# Patient Record
Sex: Female | Born: 1940 | Race: White | Hispanic: No | Smoking: Former smoker
Health system: Southern US, Community
[De-identification: ages and names within clinical notes are randomized; demographics above are authoritative.]

---

## 2021-04-15 ENCOUNTER — Other Ambulatory Visit: Payer: Self-pay

## 2021-04-15 ENCOUNTER — Ambulatory Visit (INDEPENDENT_AMBULATORY_CARE_PROVIDER_SITE_OTHER): Payer: Medicare Other | Admitting: Urology

## 2021-04-15 ENCOUNTER — Encounter: Payer: Self-pay | Admitting: Urology

## 2021-04-15 VITALS — BP 125/62 | HR 96

## 2021-04-15 DIAGNOSIS — R339 Retention of urine, unspecified: Secondary | ICD-10-CM | POA: Diagnosis not present

## 2021-04-15 NOTE — Progress Notes (Signed)
   04/15/2021 1:41 PM   Dawn Guerra 1941-03-31 [id number]  Referring provider: No referring provider defined for this encounter.  No chief complaint on file.   HPI: I was consulted today to assess the patient but she states she had no bladder complaint.  She did not know why she was here.  She denies any specific complaint and I could not see anything in the medical record  She voids every 2-3 hours.  She gets up once or twice at night.  She is continent  No history of bladder surgery urinary tract infections or kidney stone  On further questioning the patient actually has a Foley catheter since she was in the hospital about a month ago.  Again she could not give me any details.   PMH: No past medical history on file.  Surgical History:   Home Medications:  Allergies as of 04/15/2021   Not on File      Medication List    as of April 15, 2021  1:41 PM   You have not been prescribed any medications.     Allergies: Not on File  Family History: No family history on file.  Social History:  has no history on file for tobacco use, alcohol use, and drug use.  ROS:                                        Physical Exam: BP 125/62   Pulse 96   Constitutional:  Alert and oriented, No acute distress. HEENT: [place] AT, moist mucus membranes.  Trachea midline, no masses.  Laboratory Data: No results found for: WBC, HGB, HCT, MCV, PLT  No results found for: CREATININE  No results found for: PSA  No results found for: TESTOSTERONE  No results found for: HGBA1C  Urinalysis No results found for: COLORURINE, APPEARANCEUR, LABSPEC, PHURINE, GLUCOSEU, HGBUR, BILIRUBINUR, KETONESUR, PROTEINUR, UROBILINOGEN, NITRITE, LEUKOCYTESUR  Pertinent Imaging:   Assessment & Plan: I thought it was best to have the patient come back in early morning visit for a trial of voiding with the nurse practitioner and we will proceed accordingly.  Pathophysiology of  retention discussed.  We are trying to get any medical records sent over  There are no diagnoses linked to this encounter.  No follow-ups on file.  Martina Sinner, MD  [facility] [address] [place], [place] [address] [phone]

## 2021-04-17 ENCOUNTER — Encounter: Payer: Self-pay | Admitting: Physician Assistant

## 2021-04-17 ENCOUNTER — Ambulatory Visit: Payer: Medicare Other | Admitting: Physician Assistant

## 2021-04-17 ENCOUNTER — Other Ambulatory Visit: Payer: Self-pay

## 2021-04-17 ENCOUNTER — Ambulatory Visit (INDEPENDENT_AMBULATORY_CARE_PROVIDER_SITE_OTHER): Payer: PRIVATE HEALTH INSURANCE | Admitting: Physician Assistant

## 2021-04-17 DIAGNOSIS — R3129 Other microscopic hematuria: Secondary | ICD-10-CM | POA: Diagnosis not present

## 2021-04-17 DIAGNOSIS — R339 Retention of urine, unspecified: Secondary | ICD-10-CM | POA: Diagnosis not present

## 2021-04-17 LAB — BLADDER SCAN AMB NON-IMAGING

## 2021-04-17 NOTE — Patient Instructions (Addendum)
Return to clinic today at 2:30pm for a bladder scan to make sure you are emptying your bladder. In the meantime, drink at least 24 ounces of fluid and urinate as needed.

## 2021-04-17 NOTE — Progress Notes (Signed)
04/17/2021 9:23 AM   Dawn Guerra 07-30-1940 [id number]  CC: Chief Complaint  Patient presents with   Urinary Retention   HPI: Dawn Guerra is a 80 y.o. female with PMH urinary retention during recent hospitalization who presents today for voiding trial.   Patient was admitted at [facility] from 01/10/2021 to 01/25/2021 after being found down by her granddaughter. She was found to be in septic shock with hypernatremia, lactic acidosis, acute encephalopathy, and AKI. She was briefly intubated with pressors and treated for bilateral pneumonia with Zosyn. 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 She was transferred to [facility] in [place] from there, where she remained until 03/04/2021. She is now residing at [facility], from where she presents today.  At some point during her illness, she was found to have urinary retention with a residual of , timing unclear but possibly during her stay at [facility]. She failed a voiding trial on 9/12 and Foley was reinserted at that time with a residual of >510mL.  Notably, her son is now her HCPOA.  She underwent a CT AP with contrast on 02/27/2021. Original images unavailable for review. Radiology read reports atrophic kidneys with diffuse parenchymal thinning. No stones or hydronephrosis. He has a bilobed 4cm left upper pole cyst. 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 Her bladder was decompressed with a Foley catheter.  Outside medical records report UA with leukocytes and RBCs, but lab reports is unavailable for review  Today she reports no acute concerns. She denies a history of difficulty urinating. She is unsure if she has seen a urologist before. She reports being confused today.  Foley catheter removed in the morning, see procedure note below for further details. She returned to clinic in the afternoon. She  has been able to urinate. PVR 64mL.  PMH: No past medical history on file.  Surgical History: History reviewed. No pertinent surgical history.  Home Medications:  Allergies as of 04/17/2021   Not on File      Medication List        Accurate as of April 17, 2021  9:23 AM. If you have any questions, ask your nurse or doctor.          Eliquis 5 MG Tabs tablet Generic drug: apixaban Take 5 mg by mouth 2 (two) times daily.   mirtazapine 15 MG tablet Commonly known as: REMERON Take 15 mg by mouth at bedtime.   omeprazole 20 MG capsule Commonly known as: PRILOSEC Take 20 mg by mouth daily.   sertraline 25 MG tablet Commonly known as: ZOLOFT Take 25 mg by mouth daily.   vitamin C 500 MG tablet Commonly known as: ASCORBIC ACID Take 500 mg by mouth daily.   Zinc-220 220 (50 Zn) MG capsule Generic drug: zinc sulfate Take 220 mg by mouth daily.        Allergies:  Not on File  Family History: No family history on file.  Social History:   has no history on file for tobacco use, alcohol use, and drug use.  Physical Exam: There were no vitals taken for this visit.  Constitutional:  Alert, no acute distress, nontoxic appearing HEENT: [place], AT Cardiovascular: No clubbing, cyanosis, or edema Respiratory: Normal respiratory effort, no increased work of breathing Skin: No rashes, bruises or suspicious lesions Neurologic: Grossly intact, no focal deficits, moving all 4 extremities Psychiatric: Normal mood and  affect  Laboratory Data: Results for orders placed or performed in visit on 04/17/21  BLADDER SCAN AMB NON-IMAGING  Result Value Ref Range   Scan Result 63mL    Assessment & Plan:   1. Urinary retention Voiding trial passed. Will plan for PVR in 1 month to confirm stability. - BLADDER SCAN AMB NON-IMAGING  2. Microscopic hematuria Noted on outside medical records. Will obtain cath UA in 1 month to reassess; if persistent, recommend hematuria workup.    Return in about 4 weeks (around 05/15/2021) for CATH UA and repeat PVR.  Carman Ching, PA-C  [facility] [address], [address] [place], [place] [address] [phone]

## 2021-05-15 ENCOUNTER — Ambulatory Visit: Payer: Medicare Other | Admitting: Physician Assistant

## 2021-05-23 ENCOUNTER — Ambulatory Visit (INDEPENDENT_AMBULATORY_CARE_PROVIDER_SITE_OTHER): Payer: Medicare Other | Admitting: Physician Assistant

## 2021-05-23 ENCOUNTER — Other Ambulatory Visit: Payer: Self-pay

## 2021-05-23 VITALS — BP 132/71 | HR 81 | Ht 60.0 in

## 2021-05-23 DIAGNOSIS — R339 Retention of urine, unspecified: Secondary | ICD-10-CM

## 2021-05-23 DIAGNOSIS — R3129 Other microscopic hematuria: Secondary | ICD-10-CM

## 2021-05-23 LAB — BLADDER SCAN AMB NON-IMAGING

## 2021-05-23 NOTE — Progress Notes (Signed)
In and Out Catheterization  Patient is present today for a I & O catheterization due to microscopic hematuria. Patient was cleaned and prepped in a sterile fashion with betadine . A 14FR cath was inserted no complications were noted , 32ml of urine return was noted, urine was light yellow in color. A clean urine sample was collected for urinalysis. Bladder was drained  And catheter was removed with out difficulty.    Performed by: Franchot Erichsen CMA & Gerarda Gunther RMA

## 2021-05-23 NOTE — Progress Notes (Signed)
05/23/2021 3:36 PM   Dawn Guerra Oct 10, 1940 [id number]  CC: Chief Complaint  Patient presents with   Urinary Retention   Follow-up   HPI: Dawn Guerra is a 80 y.o. female with a recent history of urinary retention associated with prolonged illness and hospitalization who passed an outpatient voiding trial with me on 04/17/2021 who presents today for repeat UA and PVR.   Today she reports no difficulty urinating since I last saw her.  She has no acute concerns today.  In-office catheterized UA today positive for 2+ blood and 2+ leukocyte esterase; urine microscopy with 6-10 WBCs/HPF. PVR 56mL.  PMH: No past medical history on file.  Surgical History: No past surgical history on file.  Home Medications:  Allergies as of 05/23/2021   Not on File      Medication List        Accurate as of May 23, 2021  3:36 PM. If you have any questions, ask your nurse or doctor.          apixaban 5 MG Tabs tablet Commonly known as: ELIQUIS Take 5 mg by mouth 2 (two) times daily.   mirtazapine 15 MG tablet Commonly known as: REMERON Take 15 mg by mouth at bedtime.   omeprazole 20 MG capsule Commonly known as: PRILOSEC Take 20 mg by mouth daily.   sertraline 25 MG tablet Commonly known as: ZOLOFT Take 25 mg by mouth daily.   vitamin C 500 MG tablet Commonly known as: ASCORBIC ACID Take 500 mg by mouth daily.   zinc sulfate 220 (50 Zn) MG capsule Take 220 mg by mouth daily.        Allergies:  Not on File  Family History: No family history on file.  Social History:   has no history on file for tobacco use, alcohol use, and drug use.  Physical Exam: BP 132/71    Pulse 81    Ht 5' (1.524 m)   Constitutional:  Alert and oriented, no acute distress, nontoxic appearing HEENT: [place], AT Cardiovascular: No clubbing, cyanosis, or edema Respiratory: Normal respiratory effort, no increased work of breathing Skin: No rashes, bruises or suspicious  lesions Neurologic: Grossly intact, no focal deficits, moving all 4 extremities Psychiatric: Normal mood and affect  Laboratory Data: Results for orders placed or performed in visit on 05/23/21  Microscopic Examination   Urine  Result Value Ref Range   WBC, UA 6-10 (A) 0 - 5 /hpf   RBC 0-2 0 - 2 /hpf   Epithelial Cells (non renal) 0-10 0 - 10 /hpf   Bacteria, UA None seen None seen/Few  Urinalysis, Complete  Result Value Ref Range   Specific Gravity, UA 1.015 1.005 - 1.030   pH, UA 5.5 5.0 - 7.5   Color, UA Yellow Yellow   Appearance Ur Hazy (A) Clear   Leukocytes,UA 2+ (A) Negative   Protein,UA Negative Negative/Trace   Glucose, UA Negative Negative   Ketones, UA Negative Negative   RBC, UA 2+ (A) Negative   Bilirubin, UA Negative Negative   Urobilinogen, Ur 0.2 0.2 - 1.0 mg/dL   Nitrite, UA Negative Negative   Microscopic Examination See below:   Bladder Scan (Post Void Residual) in office  Result Value Ref Range   Scan Result 42mL    Assessment & Plan:   1. Urinary retention Resolved.  No further intervention indicated.  With no evidence of urologic history, okay to follow-up as needed. - Bladder Scan (Post Void Residual) in office  2.  Microscopic hematuria None on catheterized UA today, no further intervention indicated. - Urinalysis, Complete  Return if symptoms worsen or fail to improve.  Debroah Loop, PA-C  [facility] [address], [place], [place] [address] [phone]

## 2021-05-25 ENCOUNTER — Other Ambulatory Visit: Payer: Self-pay

## 2021-05-25 ENCOUNTER — Encounter: Payer: Self-pay | Admitting: Emergency Medicine

## 2021-05-25 ENCOUNTER — Emergency Department: Payer: Medicare Other

## 2021-05-25 ENCOUNTER — Emergency Department
Admission: EM | Admit: 2021-05-25 | Discharge: 2021-05-25 | Disposition: A | Discharge status: Home / Self Care | Payer: Medicare Other | Attending: Emergency Medicine | Admitting: Emergency Medicine

## 2021-05-25 DIAGNOSIS — R42 Dizziness and giddiness: Secondary | ICD-10-CM | POA: Insufficient documentation

## 2021-05-25 DIAGNOSIS — S0990XA Unspecified injury of head, initial encounter: Secondary | ICD-10-CM | POA: Diagnosis not present

## 2021-05-25 DIAGNOSIS — Y92002 Bathroom of unspecified non-institutional (private) residence single-family (private) house as the place of occurrence of the external cause: Secondary | ICD-10-CM | POA: Insufficient documentation

## 2021-05-25 DIAGNOSIS — W01198A Fall on same level from slipping, tripping and stumbling with subsequent striking against other object, initial encounter: Secondary | ICD-10-CM | POA: Insufficient documentation

## 2021-05-25 DIAGNOSIS — W19XXXA Unspecified fall, initial encounter: Secondary | ICD-10-CM

## 2021-05-25 LAB — BASIC METABOLIC PANEL
Anion gap: 7 (ref 5–15)
BUN: 31 mg/dL — ABNORMAL HIGH (ref 8–23)
CO2: 28 mmol/L (ref 22–32)
Calcium: 9 mg/dL (ref 8.9–10.3)
Chloride: 103 mmol/L (ref 98–111)
Creatinine, Ser: 0.95 mg/dL (ref 0.44–1.00)
GFR, Estimated: >60 mL/min (ref >60)
Glucose, Bld: 104 mg/dL — ABNORMAL HIGH (ref 70–99)
Potassium: 4 mmol/L (ref 3.5–5.1)
Sodium: 138 mmol/L (ref 135–145)

## 2021-05-25 LAB — CBC
HCT: 39.9 % (ref 36.0–46.0)
Hemoglobin: 12.3 g/dL (ref 12.0–15.0)
MCH: 26.7 pg (ref 26.0–34.0)
MCHC: 30.8 g/dL (ref 30.0–36.0)
MCV: 86.7 fL (ref 80.0–100.0)
Platelets: 245 10*3/uL (ref 150–400)
RBC: 4.6 MIL/uL (ref 3.87–5.11)
RDW: 14.6 % (ref 11.5–15.5)
WBC: 6.9 10*3/uL (ref 4.0–10.5)
nRBC: 0 % (ref 0.0–0.2)

## 2021-05-25 MED ORDER — MECLIZINE HCL 25 MG PO TABS: 25.0000 mg | ORAL_TABLET | Freq: Three times a day (TID) | ORAL | 0 refills | Status: AC | PRN

## 2021-05-25 NOTE — ED Provider Notes (Signed)
[facility] Emergency Department Provider Note   ____________________________________________   Event Date/Time   First MD Initiated Contact with Patient 05/25/21 1928     (approximate)  I have reviewed the triage vital signs and the nursing notes.   HISTORY  Chief Complaint Dizziness    HPI Dawn Guerra is a 80 y.o. female PE on Eliquis, GERD, and cognitive deficit who presents to the ED complaining of dizziness.  Patient reports that 3 days ago she fell in the bathroom and hit her head, is unsure whether she lost consciousness.  She states that since then she has felt dizzy with a sensation of the room spinning around her whenever she turns her head to the left.  She denies any headache, neck pain, vision changes, speech changes, numbness, or weakness.  She states she has never dealt with similar symptoms in the past, denies any history of vertigo.  She has not had any cough, chest pain, or shortness of breath.  She is currently residing at [facility] and was brought to the ED by EMS.        No past medical history on file.  There are no problems to display for this patient.   No past surgical history on file.  Prior to Admission medications   Medication Sig Start Date End Date Taking? Authorizing Provider  meclizine (ANTIVERT) 25 MG tablet Take 1 tablet (25 mg total) by mouth 3 (three) times daily as needed for dizziness. 05/25/21  Yes Chesley Noon, MD  apixaban (ELIQUIS) 5 MG TABS tablet Take 5 mg by mouth 2 (two) times daily.    [provider]  mirtazapine (REMERON) 15 MG tablet Take 15 mg by mouth at bedtime.    [provider]  omeprazole (PRILOSEC) 20 MG capsule Take 20 mg by mouth daily.    [provider]  sertraline (ZOLOFT) 25 MG tablet Take 25 mg by mouth daily.    [provider]  vitamin C (ASCORBIC ACID) 500 MG tablet Take 500 mg by mouth daily.    [provider]  zinc sulfate  220 (50 Zn) MG capsule Take 220 mg by mouth daily.    [provider]    Allergies Shellfish allergy  No family history on file.  Social History Social History   Tobacco Use   Smoking status: Never   Smokeless tobacco: Never    Review of Systems  Constitutional: No fever/chills Eyes: No visual changes. ENT: No sore throat. Cardiovascular: Denies chest pain. Respiratory: Denies shortness of breath. Gastrointestinal: No abdominal pain.  No nausea, no vomiting.  No diarrhea.  No constipation. Genitourinary: Negative for dysuria. Musculoskeletal: Negative for back pain. Skin: Negative for rash. Neurological: Negative for headaches, focal weakness or numbness.  Positive for dizziness.  ____________________________________________   PHYSICAL EXAM:  VITAL SIGNS: ED Triage Vitals [05/25/21 1714]  Enc Vitals Group     BP (!) 165/85     Pulse Rate 96     Resp 20     Temp 98.1 F (36.7 C)     Temp Source Oral     SpO2 94 %     Weight 196 lb (88.9 kg)     Height 5\' 4"  (1.626 m)     Head Circumference      Peak Flow      Pain Score 0     Pain Loc      Pain Edu?      Excl. in GC?  Constitutional: Alert and oriented. Eyes: Conjunctivae are normal.  Pupils equal, round, and reactive to light bilaterally. Head: Atraumatic. Nose: No congestion/rhinnorhea. Mouth/Throat: Mucous membranes are moist. Neck: Normal ROM Cardiovascular: Normal rate, regular rhythm. Grossly normal heart sounds.  2+ radial pulses bilaterally. Respiratory: Normal respiratory effort.  No retractions. Lungs CTAB. Gastrointestinal: Soft and nontender. No distention. Genitourinary: deferred Musculoskeletal: No lower extremity tenderness nor edema. Neurologic:  Normal speech and language. No gross focal neurologic deficits are appreciated. Skin:  Skin is warm, dry and intact. No rash noted. Psychiatric: Mood and affect are normal. Speech and behavior are  normal.  ____________________________________________   LABS (all labs ordered are listed, but only abnormal results are displayed)  Labs Reviewed  BASIC METABOLIC PANEL - Abnormal; Notable for the following components:      Result Value   Glucose, Bld 104 (*)    BUN 31 (*)    All other components within normal limits  CBC  URINALYSIS, ROUTINE W REFLEX MICROSCOPIC  CBG MONITORING, ED   ____________________________________________  EKG  ED ECG REPORT I, Chesley Noon, the attending physician, personally viewed and interpreted this ECG.   Date: 05/25/2021  EKG Time: 17:16  Rate: 68  Rhythm: normal sinus rhythm  Axis: LAD  Intervals:none  ST&T Change: None   PROCEDURES  Procedure(s) performed (including Critical Care):  Procedures   ____________________________________________   INITIAL IMPRESSION / ASSESSMENT AND PLAN / ED COURSE      80 year old female with past medical history of PE on Eliquis, cognitive deficit, and GERD who presents to the ED complaining of dizziness and sensation of the room spinning around her when she turns her head to the left since a fall 3 days ago.  Patient is alert and oriented, has no focal neurologic deficits on exam.  CT head and cervical spine obtained from triage and are negative for acute process.  EKG shows no evidence of arrhythmia or ischemia and labs are unremarkable.  Symptoms sound consistent with a peripheral vertigo versus concussion.  She is appropriate for outpatient management and will be prescribed meclizine for use as needed.  She was counseled to return to the ED for new worsening symptoms, patient agrees with plan.      ____________________________________________   FINAL CLINICAL IMPRESSION(S) / ED DIAGNOSES  Final diagnoses:  Vertigo  Dizziness  Fall, initial encounter     ED Discharge Orders          Ordered    meclizine (ANTIVERT) 25 MG tablet  3 times daily PRN        05/25/21 2001              Note:  This document was prepared using Dragon voice recognition software and may include unintentional dictation errors.    Chesley Noon, MD 05/25/21 2005

## 2021-05-25 NOTE — ED Triage Notes (Signed)
Patient arrived by EMS from [facility]. Reports fall a few days and hitting head. C/o when laying down and looking to the left getting dizzy. V/S WNL per EMS

## 2021-05-25 NOTE — ED Triage Notes (Signed)
Pt via EMS from [facility]. Pt states she fell hit her head 3-4 days ago and since then she has increased dizziness when she lays on her left side. Pt A&OX4 and NAD.

## 2021-05-28 LAB — URINALYSIS, COMPLETE
Bilirubin, UA: NEGATIVE
Glucose, UA: NEGATIVE
Ketones, UA: NEGATIVE
Nitrite, UA: NEGATIVE
Protein,UA: NEGATIVE
Specific Gravity, UA: 1.015 (ref 1.005–1.030)
Urobilinogen, Ur: 0.2 mg/dL (ref 0.2–1.0)
pH, UA: 5.5 (ref 5.0–7.5)

## 2021-05-28 LAB — MICROSCOPIC EXAMINATION: Bacteria, UA: NONE SEEN

## 2022-03-17 ENCOUNTER — Telehealth: Payer: Self-pay

## 2022-03-17 NOTE — Telephone Encounter (Signed)
Zenovia Jordan from [facility] called to schedule new patient appt, per the PA at nursing facility where patient lives.  Appointment scheduled.  Transportation confirmed. Georg Ruddle, RN

## 2022-03-24 ENCOUNTER — Ambulatory Visit: Payer: Medicare Other | Admitting: Cardiology

## 2022-03-26 ENCOUNTER — Ambulatory Visit: Payer: Medicare Other | Admitting: Family

## 2022-03-26 ENCOUNTER — Telehealth: Payer: Self-pay | Admitting: Family

## 2022-03-26 NOTE — Telephone Encounter (Signed)
Patient did not show for her initial [facility] appointment on 03/26/22. Will attempt to reschedule.

## 2022-05-12 ENCOUNTER — Ambulatory Visit: Payer: Medicare Other | Attending: Cardiology | Admitting: Cardiology

## 2022-05-12 ENCOUNTER — Encounter: Payer: Self-pay | Admitting: Cardiology

## 2022-05-12 VITALS — BP 128/70 | HR 70 | Ht 63.0 in | Wt 232.8 lb

## 2022-05-12 DIAGNOSIS — R011 Cardiac murmur, unspecified: Secondary | ICD-10-CM

## 2022-05-12 NOTE — Progress Notes (Signed)
Cardiology Office Note:    Date:  05/12/2022   ID:  Dawn Guerra, DOB Mar 13, 1941, MRN [id number]  PCP:  Aviva Kluver   [facility] Providers Cardiologist:  Debbe Odea, MD     Referring MD: Wyman Songster, AGNP-C   Chief Complaint  Patient presents with   New Patient (Initial Visit)    Consult to r/o Heart failure    History of Present Illness:    Dawn Guerra is a 81 y.o. female with a hx of PE, morbid obesity who presents with systolic murmur.  Patient states being told she had a systolic murmur in the past.  Denies chest pain or shortness of breath.  Has occasional cough when she lays flat.  Denies palpitations, denies any history of heart disease.  Overall feels well, ambulates with a walker, no concerns at this time.  History reviewed. No pertinent past medical history.  History reviewed. No pertinent surgical history.  Current Medications: Current Meds  Medication Sig   aspirin EC 325 MG tablet Take 325 mg by mouth every 6 (six) hours as needed for mild pain.   vitamin C (ASCORBIC ACID) 500 MG tablet Take 500 mg by mouth daily.     Allergies:   Shellfish allergy   Social History   Socioeconomic History   Marital status: Widowed    Spouse name: Not on file   Number of children: Not on file   Years of education: Not on file   Highest education level: Not on file  Occupational History   Not on file  Tobacco Use   Smoking status: Former    Types: Cigarettes   Smokeless tobacco: Never  Substance and Sexual Activity   Alcohol use: Not Currently   Drug use: Never   Sexual activity: Not on file  Other Topics Concern   Not on file  Social History Narrative   Not on file   Social Determinants of Health   Financial Resource Strain: Not on file  Food Insecurity: Not on file  Transportation Needs: Not on file  Physical Activity: Not on file  Stress: Not on file  Social Connections: Not on file     Family History: The patient's family history  includes Heart disease in her brother.  ROS:   Please see the history of present illness.     All other systems reviewed and are negative.  EKGs/Labs/Other Studies Reviewed:    The following studies were reviewed today:   EKG:  EKG is  ordered today.  The ekg ordered today demonstrates normal sinus rhythm,  Recent Labs: 05/25/2021: BUN 31; Creatinine, Ser 0.95; Hemoglobin 12.3; Platelets 245; Potassium 4.0; Sodium 138  Recent Lipid Panel No results found for: "CHOL", "TRIG", "HDL", "CHOLHDL", "VLDL", "LDLCALC", "LDLDIRECT"   Risk Assessment/Calculations:             Physical Exam:    VS:  BP 128/70 (BP Location: Right Arm)   Pulse 70   Ht 5\' 3"  (1.6 m)   Wt 232 lb 12.8 oz (105.6 kg)   SpO2 93%   BMI 41.24 kg/m     Wt Readings from Last 3 Encounters:  05/12/22 232 lb 12.8 oz (105.6 kg)  05/25/21 196 lb (88.9 kg)     GEN:  Well nourished, well developed in no acute distress HEENT: Normal NECK: No JVD; No carotid bruits CARDIAC: RRR, faint systolic murmur RESPIRATORY:  Clear to auscultation without rales, wheezing or rhonchi  ABDOMEN: Soft, non-tender, distended MUSCULOSKELETAL:  No edema;  No deformity  SKIN: Warm and dry NEUROLOGIC:  Alert and oriented x 3 PSYCHIATRIC:  Normal affect   ASSESSMENT:    1. Systolic murmur   2. Morbid obesity (HCC)    PLAN:    In order of problems listed above:  Systolic murmur, get echo to evaluate any significant structural abnormalities. Morbid obesity, low-calorie diet, weight loss advised.  Follow-up after echo.       Medication Adjustments/Labs and Tests Ordered: Current medicines are reviewed at length with the patient today.  Concerns regarding medicines are outlined above.  Orders Placed This Encounter  Procedures   EKG 12-Lead   ECHOCARDIOGRAM COMPLETE   No orders of the defined types were placed in this encounter.   Patient Instructions  Medication Instructions:   Your physician recommends that you  continue on your current medications as directed. Please refer to the Current Medication list given to you today.  *If you need a refill on your cardiac medications before your next appointment, please call your pharmacy*   Lab Work:  None Ordered  If you have labs (blood work) drawn today and your tests are completely normal, you will receive your results only by: MyChart Message (if you have MyChart) OR A paper copy in the mail If you have any lab test that is abnormal or we need to change your treatment, we will call you to review the results.   Testing/Procedures:  Echocardiogram   Your physician has requested that you have an echocardiogram. Echocardiography is a painless test that uses sound waves to create images of your heart. It provides your doctor with information about the size and shape of your heart and how well your heart's chambers and valves are working. This procedure takes approximately one hour. There are no restrictions for this procedure. Please note; depending on visual quality an IV may need to be placed.    Follow-Up: At [facility], you and your health needs are our priority.  As part of our continuing mission to provide you with exceptional heart care, we have created designated Provider Care Teams.  These Care Teams include your primary Cardiologist (physician) and Advanced Practice Providers (APPs -  Physician Assistants and Nurse Practitioners) who all work together to provide you with the care you need, when you need it.  We recommend signing up for the patient portal called "MyChart".  Sign up information is provided on this After Visit Summary.  MyChart is used to connect with patients for Virtual Visits (Telemedicine).  Patients are able to view lab/test results, encounter notes, upcoming appointments, etc.  Non-urgent messages can be sent to your provider as well.   To learn more about what you can do with MyChart, go to [email or web address].     Your next appointment:     After Echocardiogram  The format for your next appointment:   In Person  Provider:   You may see Debbe Odea, MD or one of the following Advanced Practice Providers on your designated Care Team:   Nicolasa Ducking, NP Eula Listen, PA-C Cadence Fransico Michael, PA-C Charlsie Quest, NP    Signed, Debbe Odea, MD  05/12/2022 2:38 PM    [facility]

## 2022-05-12 NOTE — Patient Instructions (Signed)
Medication Instructions:   Your physician recommends that you continue on your current medications as directed. Please refer to the Current Medication list given to you today.  *If you need a refill on your cardiac medications before your next appointment, please call your pharmacy*   Lab Work:  None Ordered  If you have labs (blood work) drawn today and your tests are completely normal, you will receive your results only by: MyChart Message (if you have MyChart) OR A paper copy in the mail If you have any lab test that is abnormal or we need to change your treatment, we will call you to review the results.   Testing/Procedures:  Echocardiogram   Your physician has requested that you have an echocardiogram. Echocardiography is a painless test that uses sound waves to create images of your heart. It provides your doctor with information about the size and shape of your heart and how well your heart's chambers and valves are working. This procedure takes approximately one hour. There are no restrictions for this procedure. Please note; depending on visual quality an IV may need to be placed.     Follow-Up: At [facility], you and your health needs are our priority.  As part of our continuing mission to provide you with exceptional heart care, we have created designated Provider Care Teams.  These Care Teams include your primary Cardiologist (physician) and Advanced Practice Providers (APPs -  Physician Assistants and Nurse Practitioners) who all work together to provide you with the care you need, when you need it.  We recommend signing up for the patient portal called "MyChart".  Sign up information is provided on this After Visit Summary.  MyChart is used to connect with patients for Virtual Visits (Telemedicine).  Patients are able to view lab/test results, encounter notes, upcoming appointments, etc.  Non-urgent messages can be sent to your provider as well.   To learn more  about what you can do with MyChart, go to [email or web address].    Your next appointment:    After Echocardiogram  The format for your next appointment:   In Person  Provider:   You may see Brian Agbor-Etang, MD or one of the following Advanced Practice Providers on your designated Care Team:   Christopher Berge, NP Ryan Dunn, PA-C Cadence Furth, PA-C Sheri Hammock, NP     

## 2022-07-01 ENCOUNTER — Ambulatory Visit: Payer: Medicare PPO | Attending: Cardiology

## 2022-07-01 DIAGNOSIS — R011 Cardiac murmur, unspecified: Secondary | ICD-10-CM

## 2022-07-02 LAB — ECHOCARDIOGRAM COMPLETE
AR max vel: 2 cm2
AV Area VTI: 2.5 cm2
AV Area mean vel: 2.13 cm2
AV Mean grad: 3 mmHg
AV Peak grad: 5.6 mmHg
Ao pk vel: 1.18 m/s
Area-P 1/2: 3.34 cm2
S' Lateral: 3.4 cm
Single Plane A4C EF: 55.9 %

## 2022-07-03 ENCOUNTER — Telehealth: Payer: Self-pay | Admitting: Cardiology

## 2022-07-03 NOTE — Telephone Encounter (Signed)
Requested documents faxed.

## 2022-07-03 NOTE — Telephone Encounter (Signed)
Spoke with Anderson Malta over at [facility] to confirm needed documents. Office visit note, after visit summary, and appointment calendar for February is all that she needs. Advised I would get that sent over to them shortly. She verbalized understanding with no further questions at this time.

## 2022-07-03 NOTE — Telephone Encounter (Signed)
[facility]  calling stating they would like pt's AVS faxed over to them with her next appt details    Fax number : [phone]

## 2022-07-07 ENCOUNTER — Encounter: Payer: Self-pay | Admitting: Cardiology

## 2022-07-07 ENCOUNTER — Ambulatory Visit: Payer: Medicare PPO | Attending: Cardiology | Admitting: Cardiology

## 2022-07-07 VITALS — BP 130/74 | HR 76 | Ht 64.0 in | Wt 230.8 lb

## 2022-07-07 DIAGNOSIS — I358 Other nonrheumatic aortic valve disorders: Secondary | ICD-10-CM

## 2022-07-07 NOTE — Progress Notes (Signed)
Cardiology Office Note:    Date:  07/07/2022   ID:  Dawn Guerra, DOB 1941/03/13, MRN [id number]  PCP:  Kathyrn Lass   [place] [facility] Cardiologist:  Kate Sable, MD     Referring MD: No ref. provider found   Chief Complaint  Patient presents with   Follow-up    Testing F/U, no new cardiac concerns     History of Present Illness:    Dawn Guerra is a 82 y.o. female with a hx of PE, morbid obesity, cardiac murmur who presents for follow-up.    Previously seen due to systolic murmur on exam.  Echocardiogram was obtained to evaluate any significant structural/valvular abnormalities.  Presents for testing results, feels well, has no new concerns at this time.  Does not have a primary care physician, would like her number to our primary physician network.   History reviewed. No pertinent past medical history.  History reviewed. No pertinent surgical history.  Current Medications: Current Meds  Medication Sig   apixaban (ELIQUIS) 5 MG TABS tablet Take 5 mg by mouth 2 (two) times daily.   aspirin EC 325 MG tablet Take 325 mg by mouth every 6 (six) hours as needed for mild pain.   omeprazole (PRILOSEC) 20 MG capsule Take 20 mg by mouth daily.   sertraline (ZOLOFT) 25 MG tablet Take 25 mg by mouth daily.   vitamin C (ASCORBIC ACID) 500 MG tablet Take 500 mg by mouth daily.   zinc sulfate 220 (50 Zn) MG capsule Take 220 mg by mouth daily.     Allergies:   Shellfish allergy   Social History   Socioeconomic History   Marital status: Widowed    Spouse name: Not on file   Number of children: Not on file   Years of education: Not on file   Highest education level: Not on file  Occupational History   Not on file  Tobacco Use   Smoking status: Former    Types: Cigarettes   Smokeless tobacco: Never  Substance and Sexual Activity   Alcohol use: Not Currently   Drug use: Never   Sexual activity: Not on file  Other Topics Concern   Not on file  Social History  Narrative   Not on file   Social Determinants of Health   Financial Resource Strain: Not on file  Food Insecurity: Not on file  Transportation Needs: Not on file  Physical Activity: Not on file  Stress: Not on file  Social Connections: Not on file     Family History: The patient's family history includes Heart disease in her brother.  ROS:   Please see the history of present illness.     All other systems reviewed and are negative.  EKGs/Labs/Other Studies Reviewed:    The following studies were reviewed today:   EKG:  EKG not ordered today.    Recent Labs: No results found for requested labs within last 365 days.  Recent Lipid Panel No results found for: "CHOL", "TRIG", "HDL", "CHOLHDL", "VLDL", "LDLCALC", "LDLDIRECT"   Risk Assessment/Calculations:             Physical Exam:    VS:  BP 130/74 (BP Location: Left Arm, Patient Position: Sitting, Cuff Size: Large)   Pulse 76   Ht 5\' 4"  (1.626 m)   Wt 230 lb 12.8 oz (104.7 kg)   SpO2 93%   BMI 39.62 kg/m     Wt Readings from Last 3 Encounters:  07/07/22 230 lb 12.8 oz (  104.7 kg)  05/12/22 232 lb 12.8 oz (105.6 kg)  05/25/21 196 lb (88.9 kg)     GEN:  Well nourished, well developed in no acute distress HEENT: Normal NECK: No JVD; No carotid bruits CARDIAC: RRR, faint systolic murmur RESPIRATORY:  Clear to auscultation without rales, wheezing or rhonchi  ABDOMEN: Soft, non-tender, distended MUSCULOSKELETAL:  No edema; No deformity  SKIN: Warm and dry NEUROLOGIC:  Alert and oriented x 3 PSYCHIATRIC:  Normal affect   ASSESSMENT:    1. Aortic valve sclerosis   2. Morbid obesity ([place])    PLAN:    In order of problems listed above:  Echo shows normal systolic function, mild aortic valve sclerosis with no significant stenosis, which is likely reason for patient's murmur.  EF normal at 60 to 65%.  Patient made aware of results. Morbid obesity, low-calorie diet, weight loss advised.  Follow-up as  needed.      Medication Adjustments/Labs and Tests Ordered: Current medicines are reviewed at length with the patient today.  Concerns regarding medicines are outlined above.  No orders of the defined types were placed in this encounter.  No orders of the defined types were placed in this encounter.   Patient Instructions  Medication Instructions:   Your physician recommends that you continue on your current medications as directed. Please refer to the Current Medication list given to you today.  *If you need a refill on your cardiac medications before your next appointment, please call your pharmacy*   Lab Work:  None Ordered  If you have labs (blood work) drawn today and your tests are completely normal, you will receive your results only by: [place] (if you have MyChart) OR A paper copy in the mail If you have any lab test that is abnormal or we need to change your treatment, we will call you to review the results.   Testing/Procedures:  None Ordered   Follow-Up: At [facility], you and your health needs are our priority.  As part of our continuing mission to provide you with exceptional heart care, we have created designated Provider Care Teams.  These Care Teams include your primary Cardiologist (physician) and Advanced Practice Providers (APPs -  Physician Assistants and Nurse Practitioners) who all work together to provide you with the care you need, when you need it.  We recommend signing up for the patient portal called "MyChart".  Sign up information is provided on this After Visit Summary.  MyChart is used to connect with patients for Virtual Visits (Telemedicine).  Patients are able to view lab/test results, encounter notes, upcoming appointments, etc.  Non-urgent messages can be sent to your provider as well.   To learn more about what you can do with MyChart, go to [email or web address].    Your next appointment:    As needed  Other  Instructions  Please set up primary care physician   You may call [place] @ [phone] for a list of primary care providers in your area Or visit their website [email or web address] Please have any insurance card available before calling or going online.      Signed, Kate Sable, MD  07/07/2022 2:51 PM    [place]

## 2022-07-07 NOTE — Patient Instructions (Signed)
Medication Instructions:   Your physician recommends that you continue on your current medications as directed. Please refer to the Current Medication list given to you today.  *If you need a refill on your cardiac medications before your next appointment, please call your pharmacy*   Lab Work:  None Ordered  If you have labs (blood work) drawn today and your tests are completely normal, you will receive your results only by: [place] (if you have MyChart) OR A paper copy in the mail If you have any lab test that is abnormal or we need to change your treatment, we will call you to review the results.   Testing/Procedures:  None Ordered   Follow-Up: At [facility], you and your health needs are our priority.  As part of our continuing mission to provide you with exceptional heart care, we have created designated Provider Care Teams.  These Care Teams include your primary Cardiologist (physician) and Advanced Practice Providers (APPs -  Physician Assistants and Nurse Practitioners) who all work together to provide you with the care you need, when you need it.  We recommend signing up for the patient portal called "MyChart".  Sign up information is provided on this After Visit Summary.  MyChart is used to connect with patients for Virtual Visits (Telemedicine).  Patients are able to view lab/test results, encounter notes, upcoming appointments, etc.  Non-urgent messages can be sent to your provider as well.   To learn more about what you can do with MyChart, go to [email or web address].    Your next appointment:    As needed  Other Instructions  Please set up primary care physician   You may call [place] @ [phone] for a list of primary care providers in your area Or visit their website [email or web address] Please have any insurance card available before calling or going online.

## 2023-11-30 IMAGING — CT CT HEAD W/O CM
4 series · 17 of 47 positions shown, 19 images · non-contrast
Comparison: No pertinent prior exams available for comparison.

CLINICAL DATA: Provided history: Head trauma, moderate/severe.
Additional history provided: Patient experience fall (hitting head)
3-4 days ago, increased dizziness since that time when lying on left
side.

EXAM:
CT HEAD WITHOUT CONTRAST
TECHNIQUE: Contiguous axial images were obtained from the base of the skull
through the vertex without intravenous contrast.

[Series 2: head wo · axial · 0.47mm/px · z∈[+418,+543]mm · 7 of 35 slices shown, 9 images]
[im 5/35  brain]
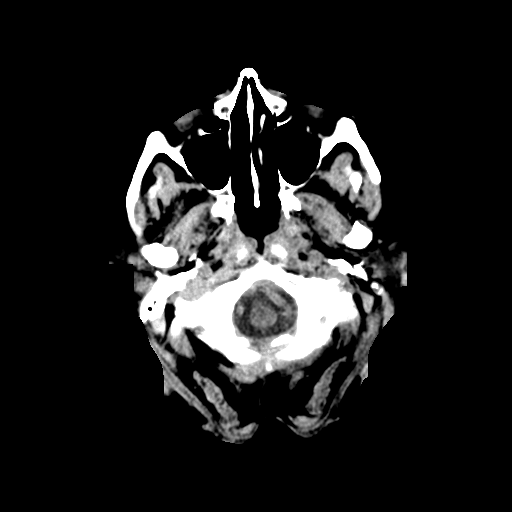
[im 5/35  bone]
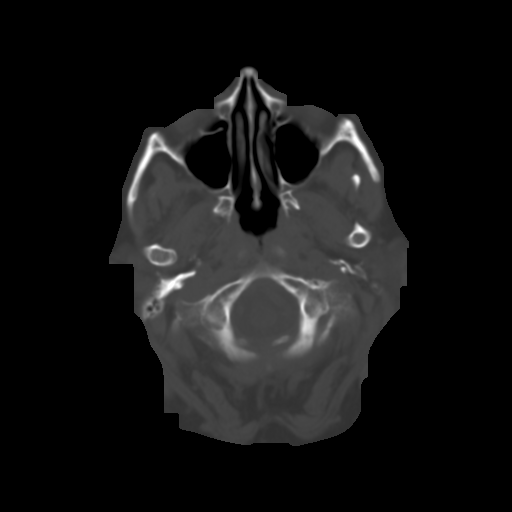
[im 9/35  brain]
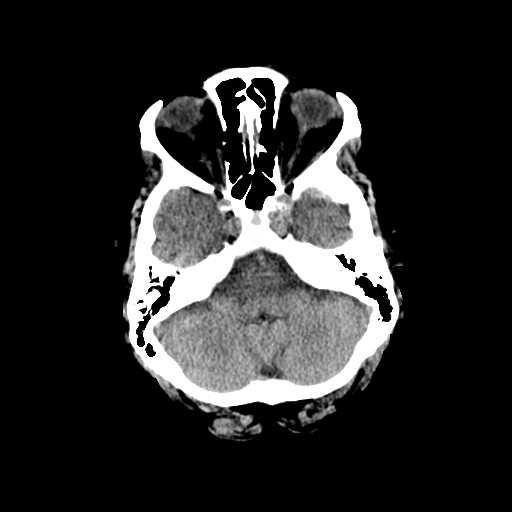
[im 13/35  brain]
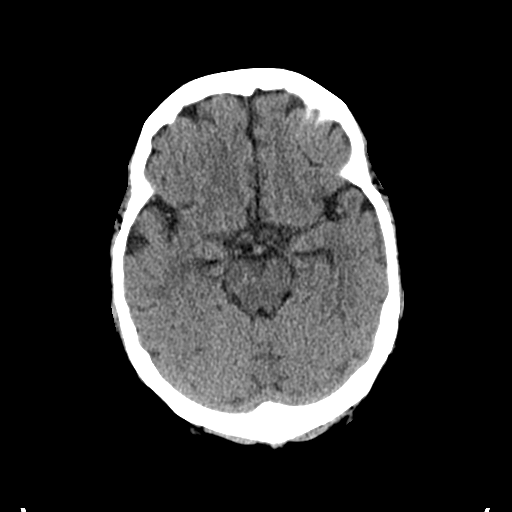
[im 18/35  brain]
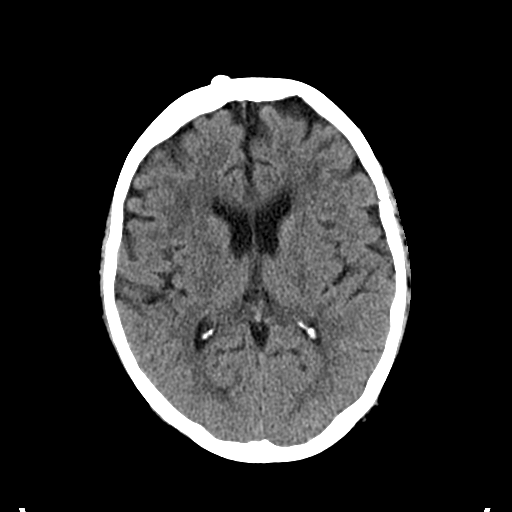
[im 22/35  brain]
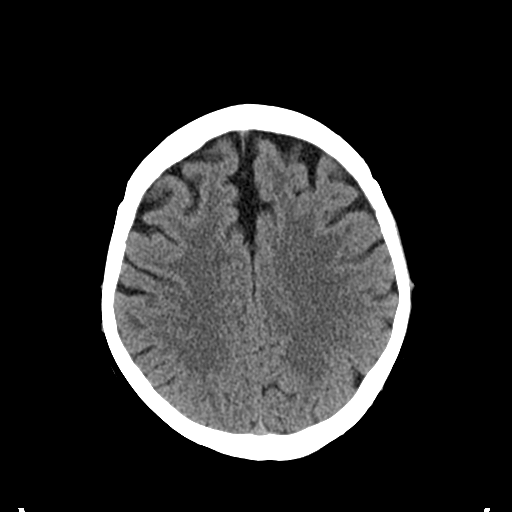
[im 22/35  bone]
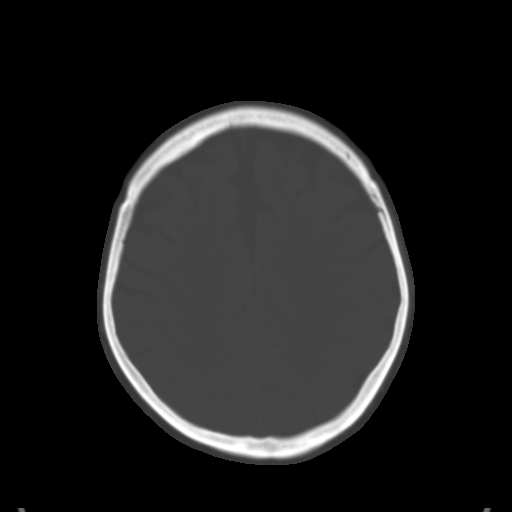
[im 26/35  brain]
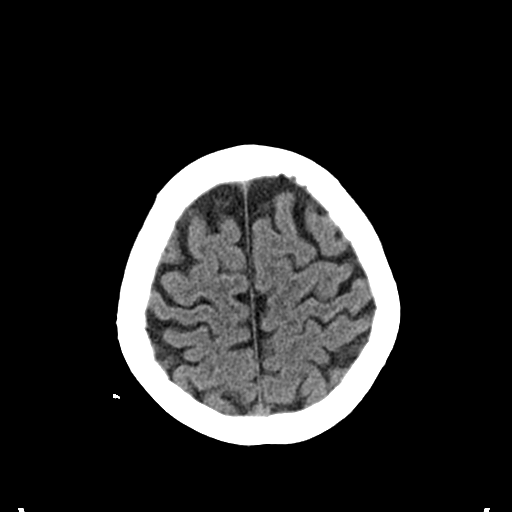
[im 30/35  brain]
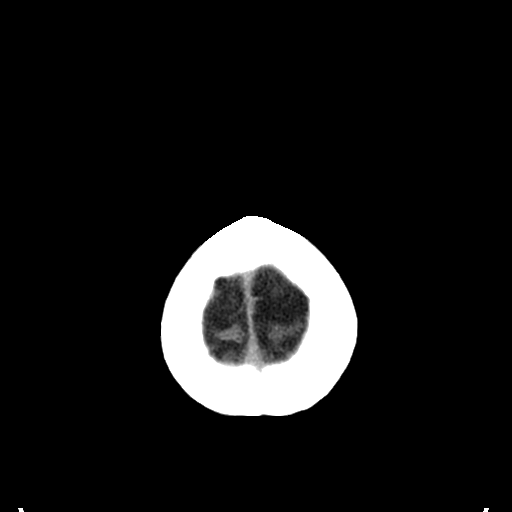

[Series 3: head bone · axial · 0.47mm/px · z∈[+414,+476]mm · 4 of 88 slices shown]
[im 9/88  bone]
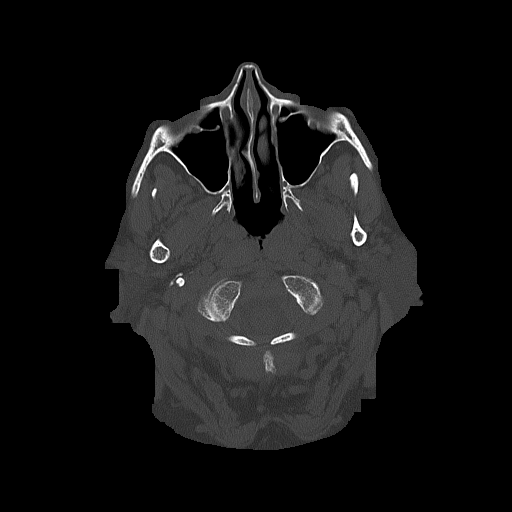
[im 18/88  bone]
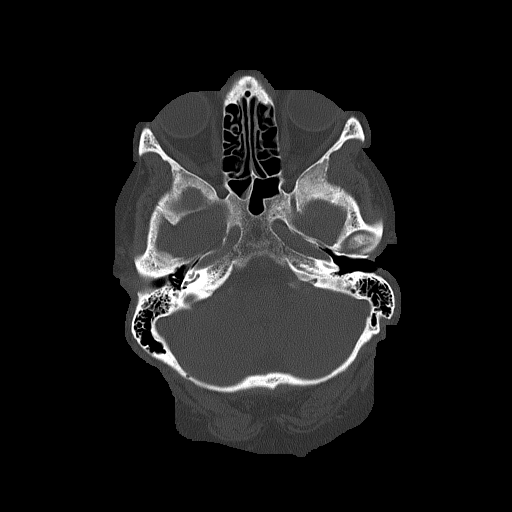
[im 27/88  bone]
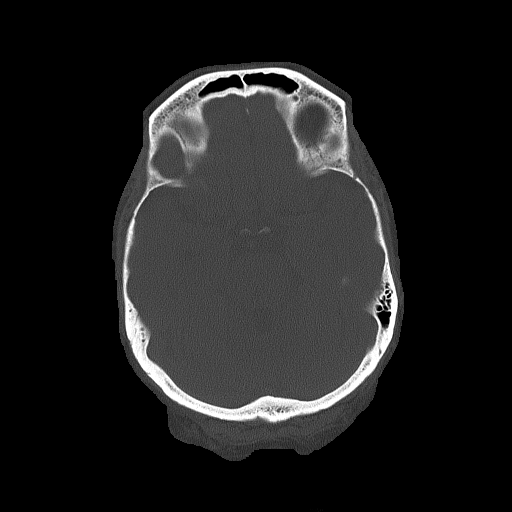
[im 40/88  bone]
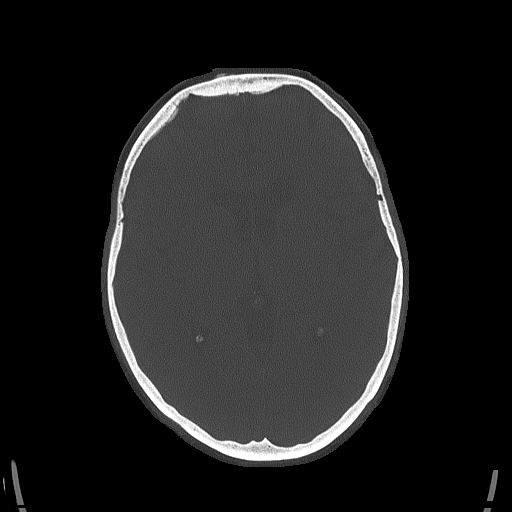

[Series 4: coronal soft tissue · coronal · 0.34mm/px · 3 of 69 slices shown]
[im 23/69  brain]
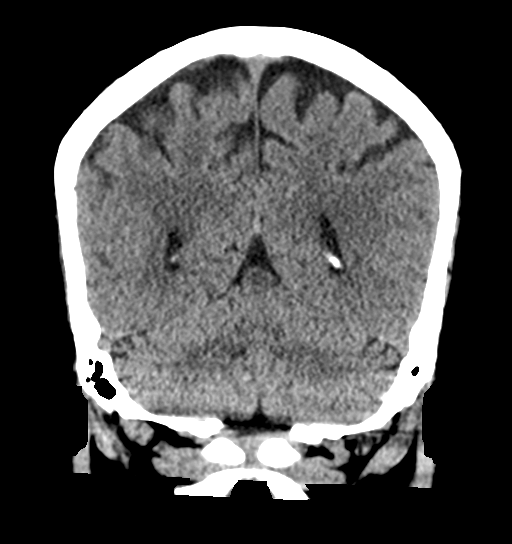
[im 31/69  brain]
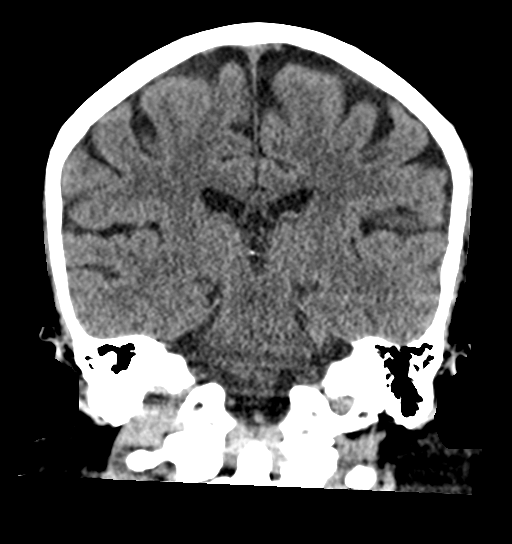
[im 38/69  brain]
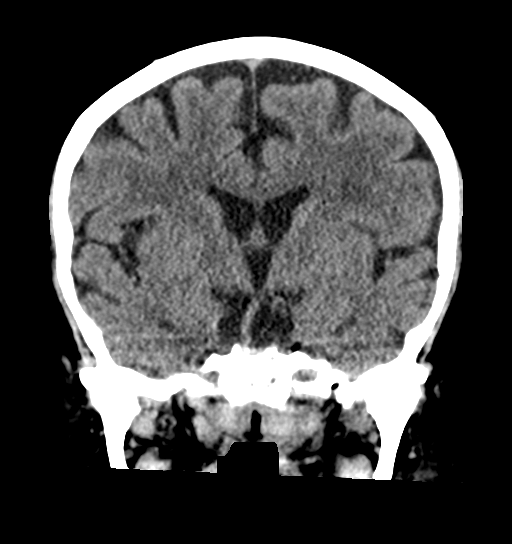

[Series 5: sagittal soft tissue · sagittal · 0.38mm/px · 3 of 58 slices shown]
[im 20/58  brain]
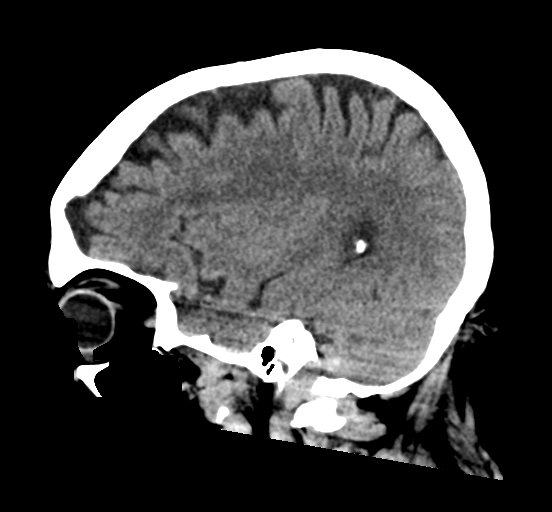
[im 29/58  brain]
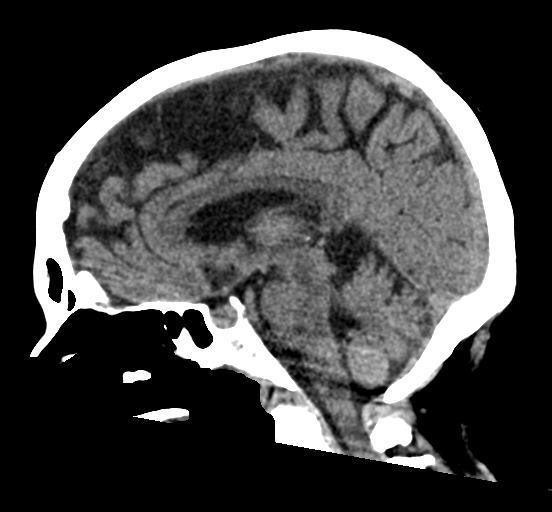
[im 39/58  brain]
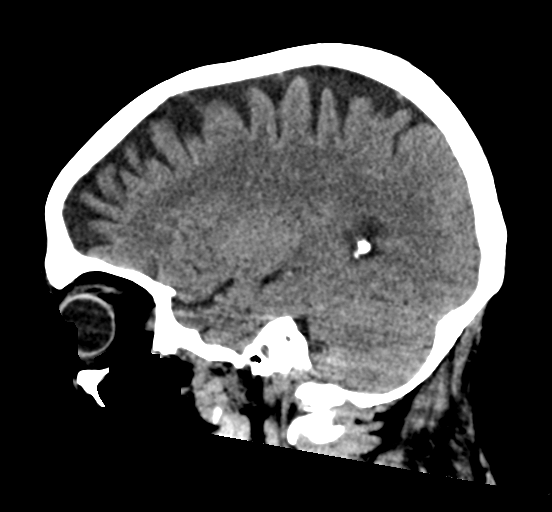

[17 of 47 positions shown; findings below may reference images not displayed]

FINDINGS: Brain:

Mild generalized parenchymal atrophy.

Mild patchy and ill-defined hypoattenuation within the cerebral
white matter, nonspecific but compatible with chronic small vessel
ischemic disease.

There is no acute intracranial hemorrhage.

No demarcated cortical infarct.

No extra-axial fluid collection.

No evidence of an intracranial mass.

No midline shift.

Vascular: No hyperdense vessel. Atherosclerotic calcifications.

Skull: No calvarial fracture. 5 mm bony exostosis extending outward
from the right frontal calvarium, likely reflecting a small osteoma.

Sinuses/Orbits: Visualized orbits show no acute finding. Minimal
mucosal thickening within the bilateral ethmoid and right sphenoid
sinuses.
IMPRESSION: No evidence of acute intracranial abnormality.

Mild chronic small vessel ischemic changes within the cerebral white
matter.

Mild generalized parenchymal atrophy.

Minimal mucosal thickening within the bilateral ethmoid and right
sphenoid sinuses.

## 2023-11-30 IMAGING — CT CT CERVICAL SPINE W/O CM
3 of 4 series · 13 of 33 positions shown, 16 images · non-contrast
Comparison: No pertinent prior exams available for comparison.

CLINICAL DATA: Provided history: Head trauma, moderate/severe.
Additional history provided: Patient fell (hitting head) 3-4 days
ago, increased dizziness since that time.

EXAM:
CT CERVICAL SPINE WITHOUT CONTRAST
TECHNIQUE: Multidetector CT imaging of the cervical spine was performed without
intravenous contrast. Multiplanar CT image reconstructions were also
generated.

[Series 6: orthogonal bone · axial · 0.31mm/px · z∈[+241,+346]mm · 5 of 89 slices shown, 7 images]
[im 15/89  soft-tissue]
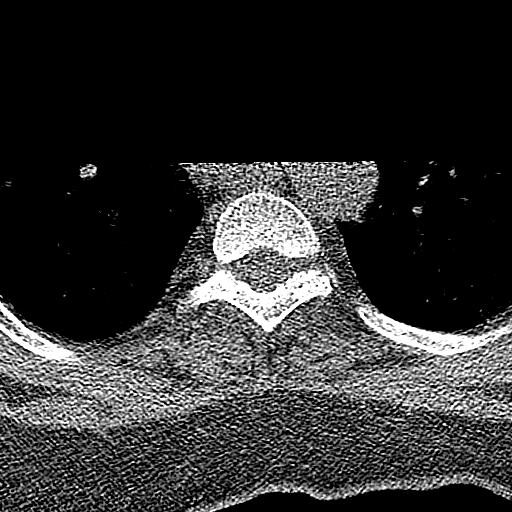
[im 15/89  bone]
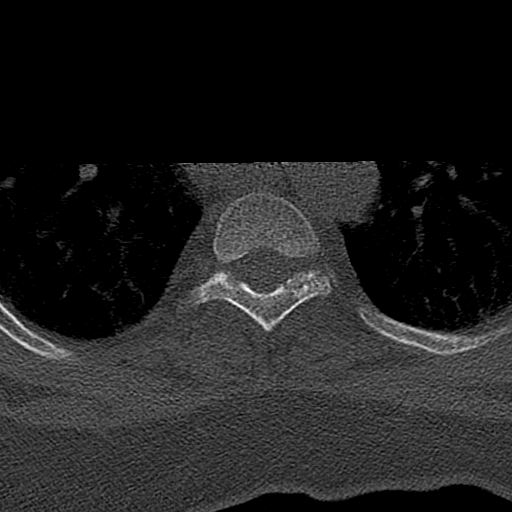
[im 30/89  bone]
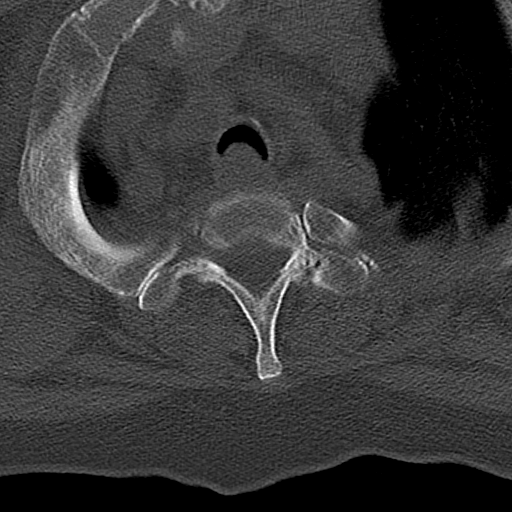
[im 45/89  bone]
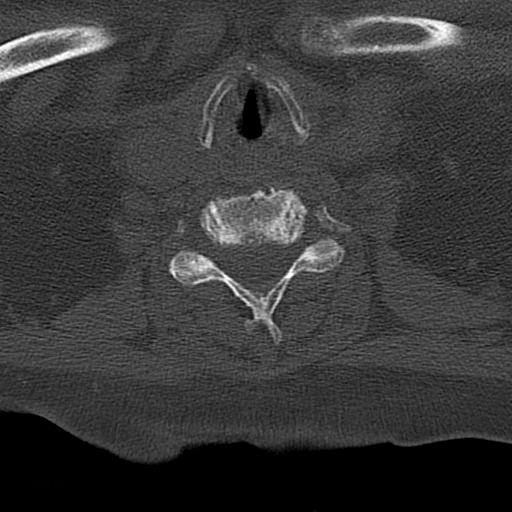
[im 59/89  bone]
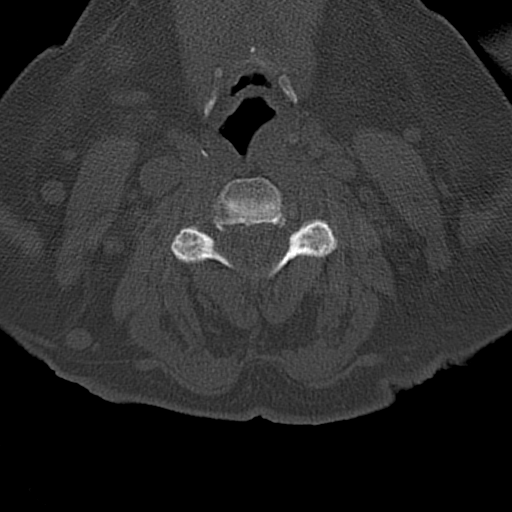
[im 74/89  soft-tissue]
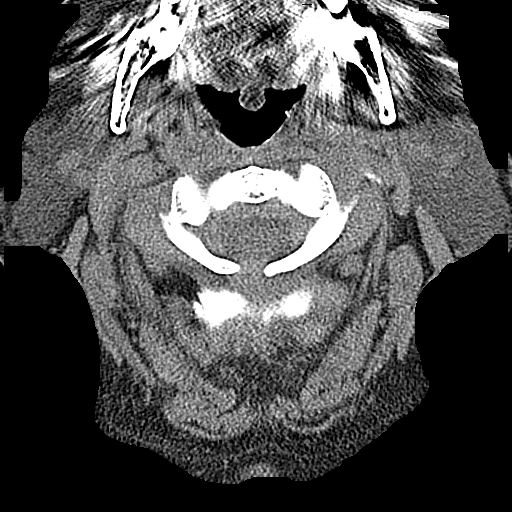
[im 74/89  bone]
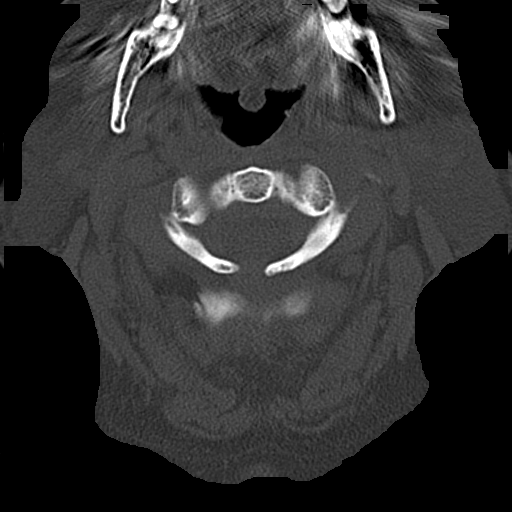

[Series 7: sagittal bone · sagittal · 0.29mm/px · 5 of 84 slices shown, 6 images]
[im 28/84  bone]
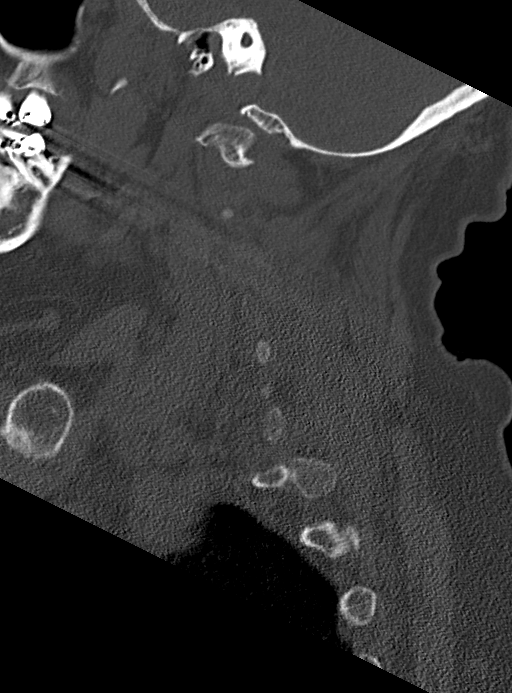
[im 35/84  bone]
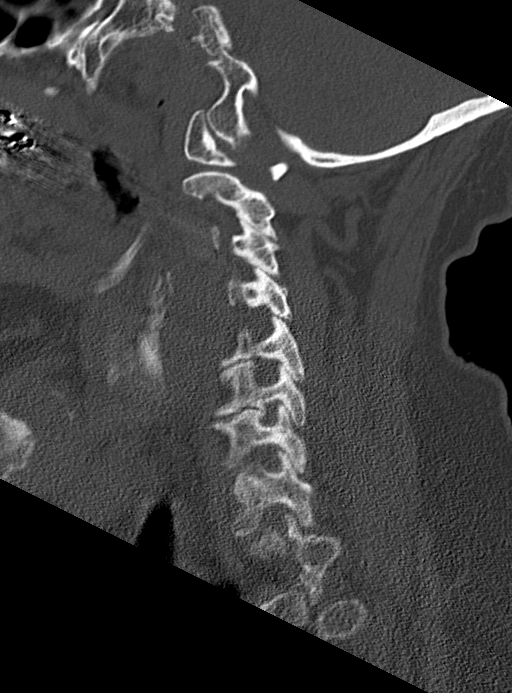
[im 42/84  soft-tissue]
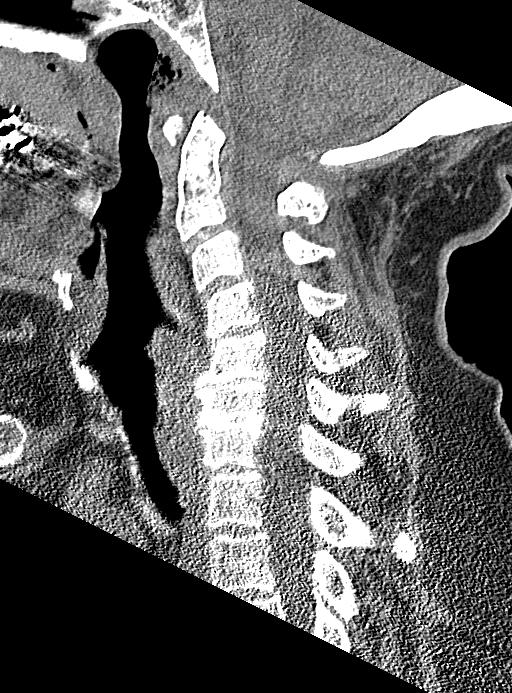
[im 42/84  bone]
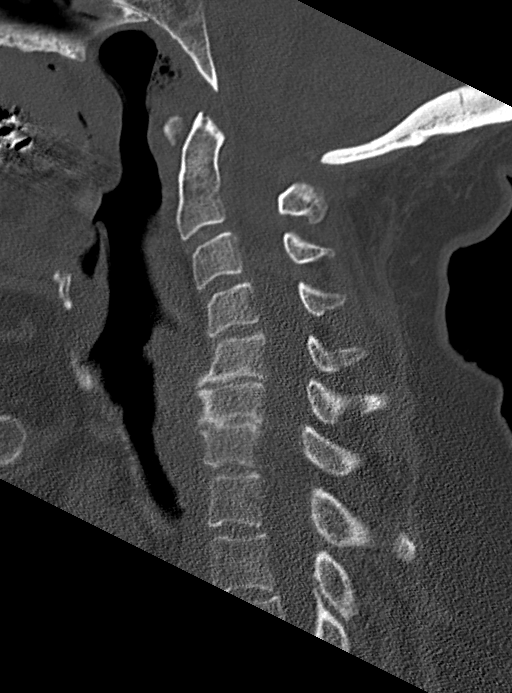
[im 49/84  bone]
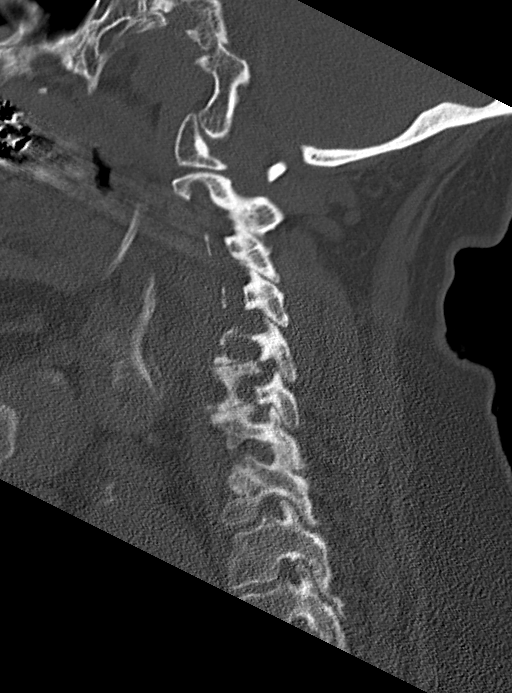
[im 56/84  bone]
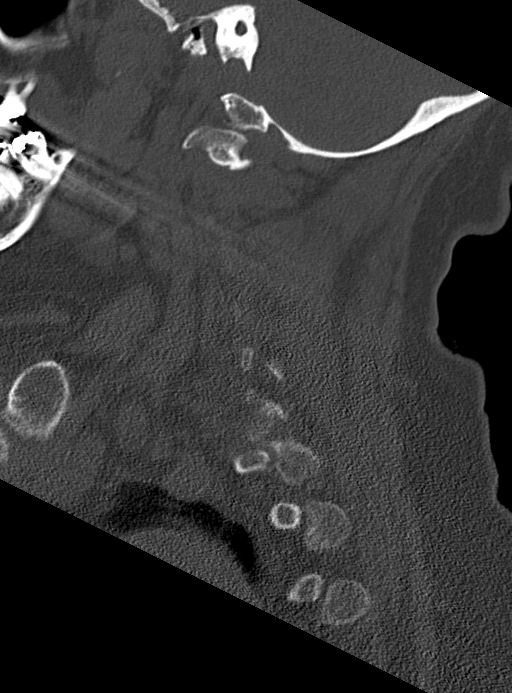

[Series 8: coronal bone · coronal · 0.34mm/px · 3 of 76 slices shown]
[im 23/76  bone]
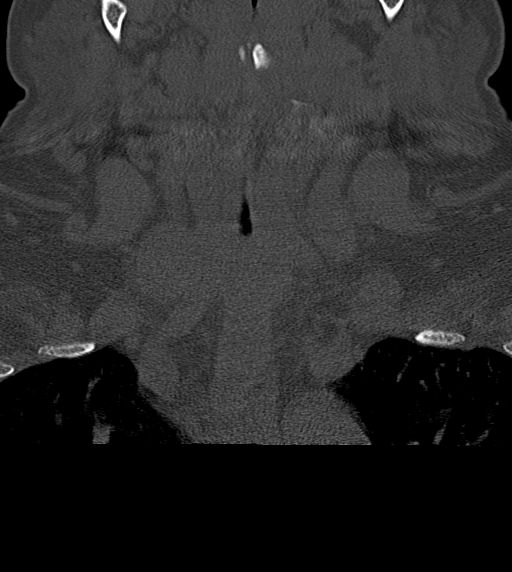
[im 33/76  bone]
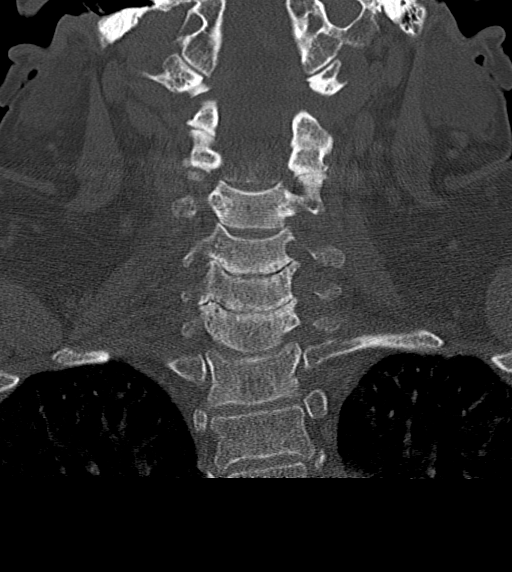
[im 43/76  bone]
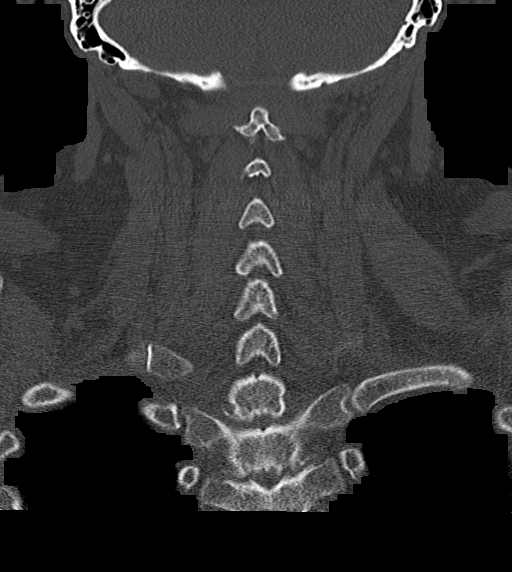

[13 of 33 positions shown; findings below may reference images not displayed]

FINDINGS: Alignment: Cervicothoracic dextrocurvature. Reversal of the expected
cervical lordosis. No significant spondylolisthesis.

Skull base and vertebrae: Congenital nonunion of the anterior and
posterior arches of C1.No evidence of acute fracture to the cervical
spine.

Soft tissues and spinal canal: No prevertebral fluid or swelling. No
visible canal hematoma.

Disc levels: Cervical spondylosis with multilevel disc space
narrowing, disc bulges, posterior disc osteophytes, endplate
spurring and uncovertebral hypertrophy. Disc space narrowing is
greatest at C5-C6 and C6-C7 (advanced at these levels). No
appreciable high-grade spinal canal stenosis. Bony neural foraminal
narrowing bilaterally at C5-C6 and C6-C7. Ventral osteophytes at
C5-C6 and C6-C7.

Upper chest: No consolidation within the imaged lung apices. No
visible pneumothorax.
IMPRESSION: No evidence of acute fracture to the cervical spine.

Congenital nonunion of the anterior and posterior arches of C1.

Cervicothoracic dextrocurvature.

Reversal of the expected cervical lordosis.

Cervical spondylosis, as described.
